# Patient Record
Sex: Male | Born: 1935 | Race: White | Hispanic: No | Marital: Married
Health system: Southern US, Community
[De-identification: ages and names within clinical notes are randomized; demographics above are authoritative.]

## PROBLEM LIST (undated history)

## (undated) DIAGNOSIS — G20 Parkinson's disease: Secondary | ICD-10-CM

---

## 2014-12-16 ENCOUNTER — Emergency Department (HOSPITAL_COMMUNITY)
Admission: EM | Admit: 2014-12-16 | Discharge: 2014-12-16 | Disposition: A | Discharge status: Home / Self Care | Payer: Medicare Other | Attending: Emergency Medicine | Admitting: Emergency Medicine

## 2014-12-16 ENCOUNTER — Encounter (HOSPITAL_COMMUNITY): Payer: Self-pay | Admitting: *Deleted

## 2014-12-16 ENCOUNTER — Emergency Department (HOSPITAL_COMMUNITY): Payer: Medicare Other

## 2014-12-16 DIAGNOSIS — Y93B2 Activity, push-ups, pull-ups, sit-ups: Secondary | ICD-10-CM | POA: Diagnosis not present

## 2014-12-16 DIAGNOSIS — F0390 Unspecified dementia without behavioral disturbance: Secondary | ICD-10-CM | POA: Insufficient documentation

## 2014-12-16 DIAGNOSIS — Y9289 Other specified places as the place of occurrence of the external cause: Secondary | ICD-10-CM | POA: Insufficient documentation

## 2014-12-16 DIAGNOSIS — Y998 Other external cause status: Secondary | ICD-10-CM | POA: Insufficient documentation

## 2014-12-16 DIAGNOSIS — G20 Parkinson's disease: Secondary | ICD-10-CM | POA: Insufficient documentation

## 2014-12-16 DIAGNOSIS — W050XXA Fall from non-moving wheelchair, initial encounter: Secondary | ICD-10-CM | POA: Diagnosis not present

## 2014-12-16 DIAGNOSIS — S43402A Unspecified sprain of left shoulder joint, initial encounter: Secondary | ICD-10-CM

## 2014-12-16 DIAGNOSIS — Z043 Encounter for examination and observation following other accident: Secondary | ICD-10-CM | POA: Diagnosis present

## 2014-12-16 DIAGNOSIS — W19XXXA Unspecified fall, initial encounter: Secondary | ICD-10-CM

## 2014-12-16 HISTORY — DX: Parkinson's disease: G20

## 2014-12-16 NOTE — ED Provider Notes (Signed)
CSN: [id number]     Arrival date & time 12/16/14  1045 History   First MD Initiated Contact with Patient 12/16/14 1104     Chief Complaint  Patient presents with  . Fall     (Consider location/radiation/quality/duration/timing/severity/associated sxs/prior Treatment) HPI  81 year old male with a history of Parkinson disease presents after falling out of his wheelchair. He was being pushed in a wheelchair up a slight incline and apparently fell out. The patient did not lose consciousness. The wife was with the patient all time and he did not hit his head. The patient does not communicate much and is not responding to questions but this is normal per the wife. It is unknown if he sustained any injuries but EMS was concerned about his clavicle.  Past Medical History  Diagnosis Date  . Parkinson disease    History reviewed. No pertinent past surgical history. No family history on file. History  Substance Use Topics  . Smoking status: Unknown If Ever Smoked  . Smokeless tobacco: Not on file  . Alcohol Use: Not on file    Review of Systems  Unable to perform ROS: Dementia      Allergies  Review of patient's allergies indicates no known allergies.  Home Medications   Prior to Admission medications   Not on File   BP 120/71 mmHg  Pulse 52  Temp(Src) 97.6 F (36.4 C) (Axillary)  Resp 20  SpO2 96% Physical Exam  Constitutional: He appears well-developed.  Chronically frail appearing. Contracted  HENT:  Head: Normocephalic and atraumatic.  Right Ear: External ear normal.  Left Ear: External ear normal.  Nose: Nose normal.  Eyes: Right eye exhibits no discharge. Left eye exhibits no discharge.  Neck: Neck supple.  Cardiovascular: Normal rate, regular rhythm, normal heart sounds and intact distal pulses.   Pulses:      Radial pulses are 2+ on the left side.  Pulmonary/Chest: Effort normal.  Abdominal: Soft. There is no tenderness.  Musculoskeletal: He exhibits no  edema.       Left shoulder: He exhibits no tenderness and no deformity.       Left elbow: He exhibits normal range of motion and no swelling.       Right hip: He exhibits normal range of motion.       Left hip: He exhibits normal range of motion.       Left upper arm: He exhibits no tenderness.  No focal tenderness on his upper extremity exam. The patient is moving his left elbow spontaneously and does not appear in pain. Left distal clavicle appears elevated. No tenting of skin  Neurological: He is alert. GCS eye subscore is 3. GCS verbal subscore is 3. GCS motor subscore is 6.  Skin: Skin is warm and dry.  Nursing note and vitals reviewed.   ED Course  Procedures (including critical care time) Labs Review Labs Reviewed - No data to display  Imaging Review Dg Clavicle Left  12/16/2014   CLINICAL DATA:  Status post fall today with a deformity about the left shoulder. Initial encounter.  EXAM: LEFT CLAVICLE - 2+ VIEWS  COMPARISON:  None.  FINDINGS: The patient has a transverse fracture through the mid diaphysis of the left clavicle. Fracture margins appear corticated. There is fragment override and superior angulation of the proximal fragment which tents the skin. Remote appearing left upper rib fractures are identified. No other fracture is seen.  IMPRESSION: Diaphyseal fracture left clavicle appears remote. No definite acute abnormality is  identified.  Remote left upper rib fractures.   Electronically Signed   By: Drusilla Kanner M.D.   On: 12/16/2014 12:08   Dg Shoulder Left  12/16/2014   CLINICAL DATA:  Fall from wheelchair with left clavicle deformity. Unresponsive.  EXAM: LEFT SHOULDER - 2+ VIEW  COMPARISON:  None.  FINDINGS: There is a mid clavicle fracture with fracture over riding and greater than 100% displacement. The fracture appears chronic as there is corticated margins and corticated bone fragments intervening. A clavicle radiograph is currently pending. Located appearance of the  glenohumeral and acromioclavicular joints. Multiple remote appearing left rib fractures. No acute fracture identified. Marked osteopenia.  IMPRESSION: 1. Remote appearing nonunited left mid clavicle fracture. 2. No acute fracture or dislocation.   Electronically Signed   By: Marnee Spring M.D.   On: 12/16/2014 12:06     EKG Interpretation None      MDM   Final diagnoses:  Fall, initial encounter  Shoulder sprain, left, initial encounter    The left clavicle is mildly elevated at its insertion to the shoulder but there is no tenderness or focal swelling. There is no tenting of the skin. He has evidence of an old clavicle fracture and my best suspicion is that this is an old finding but has never been noticed because no one looks his clavicle. He is not tender on repeat movement of the shoulder. I will give him a shoulder sling just in case but at this point there are no obvious injuries. He did not hit his head, lose consciousness, and there is no trauma that I can see. Hips move with passive range of motion without pain. Patient is stable for discharge back to his facility, follow-up with PCP.    Pricilla Loveless, MD 12/16/14 1236

## 2014-12-16 NOTE — ED Notes (Signed)
Wife at nurses station requesting information on a missing key around patients neck.  EMS that dropped patient off from facility denies recollection of a key.  Secretary called facility to determine location of the key, facility stated the key could be in the room at the facility.

## 2014-12-16 NOTE — ED Notes (Signed)
Per EMS- pt was sent from [facility] for a fall from his wheelchair. Pt was in wheel chair when he slid out of the chair and sat on the foot rests. Staff found him with his wife. Staff denied LOC ir hitting head. Pt noted to have deformity to left clavicle. Staff states that pt is minimally verbal at baseline.

## 2015-03-05 DEATH — deceased

## 2017-02-04 IMAGING — DX DG SHOULDER 2+V*L*
2 series · 3 of 3 positions shown · non-contrast
Comparison: None.

CLINICAL DATA: Fall from wheelchair with left clavicle deformity.
Unresponsive.

EXAM:
LEFT SHOULDER - 2+ VIEW

[Series 1: shoulder grashey · 0.14mm/px · 2 of 2 slices shown]
[im 1/2]
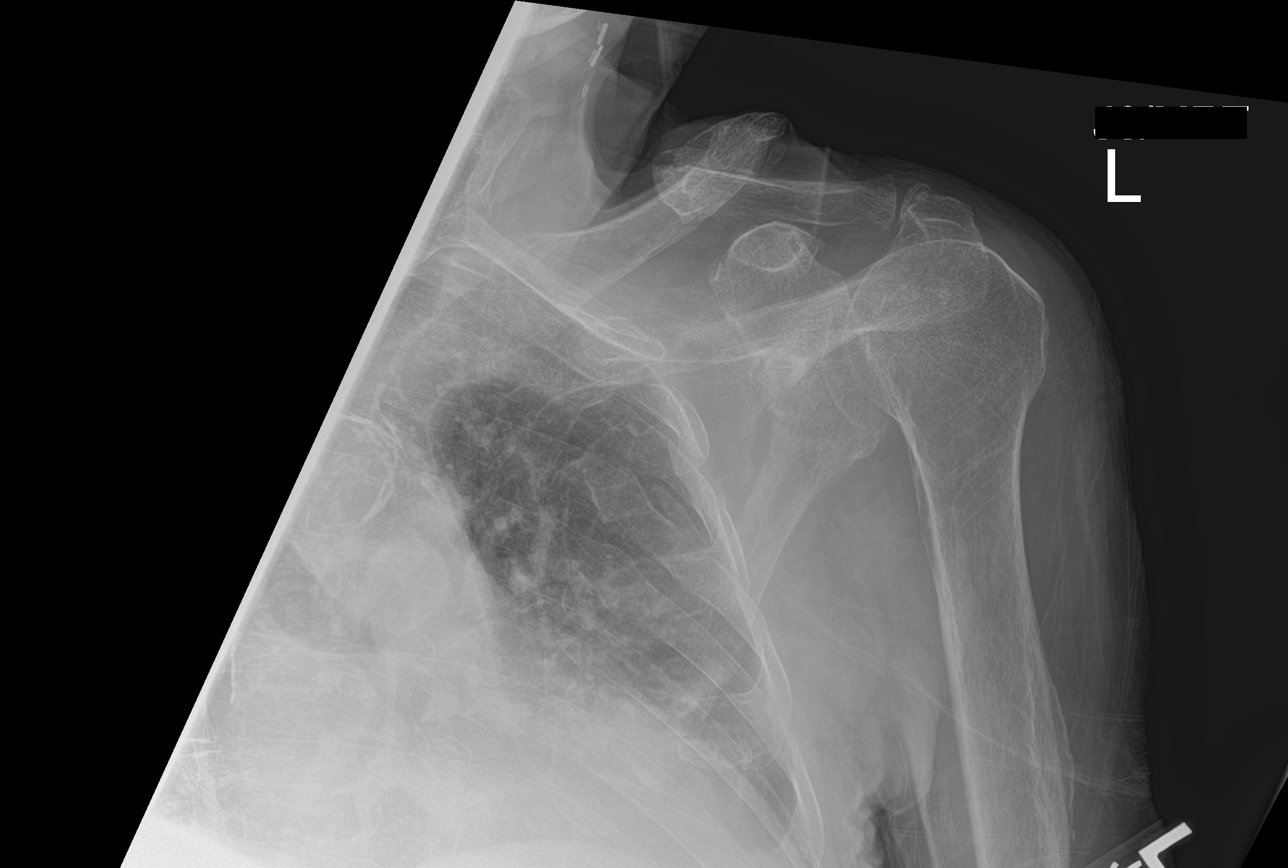
[im 2/2]
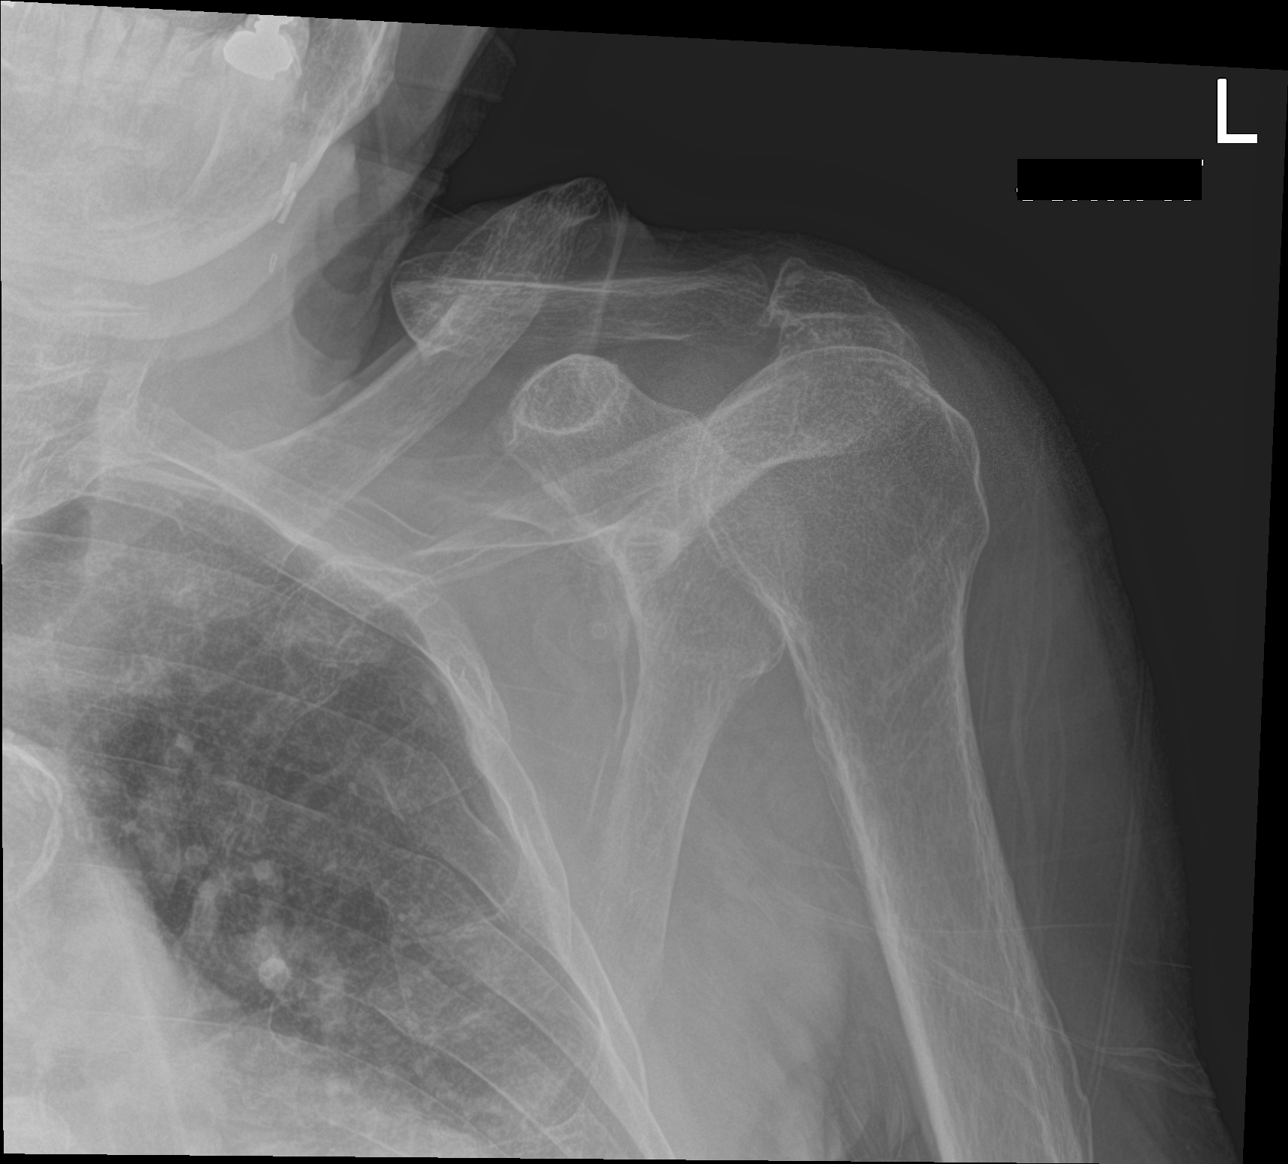

[shoulder y view]
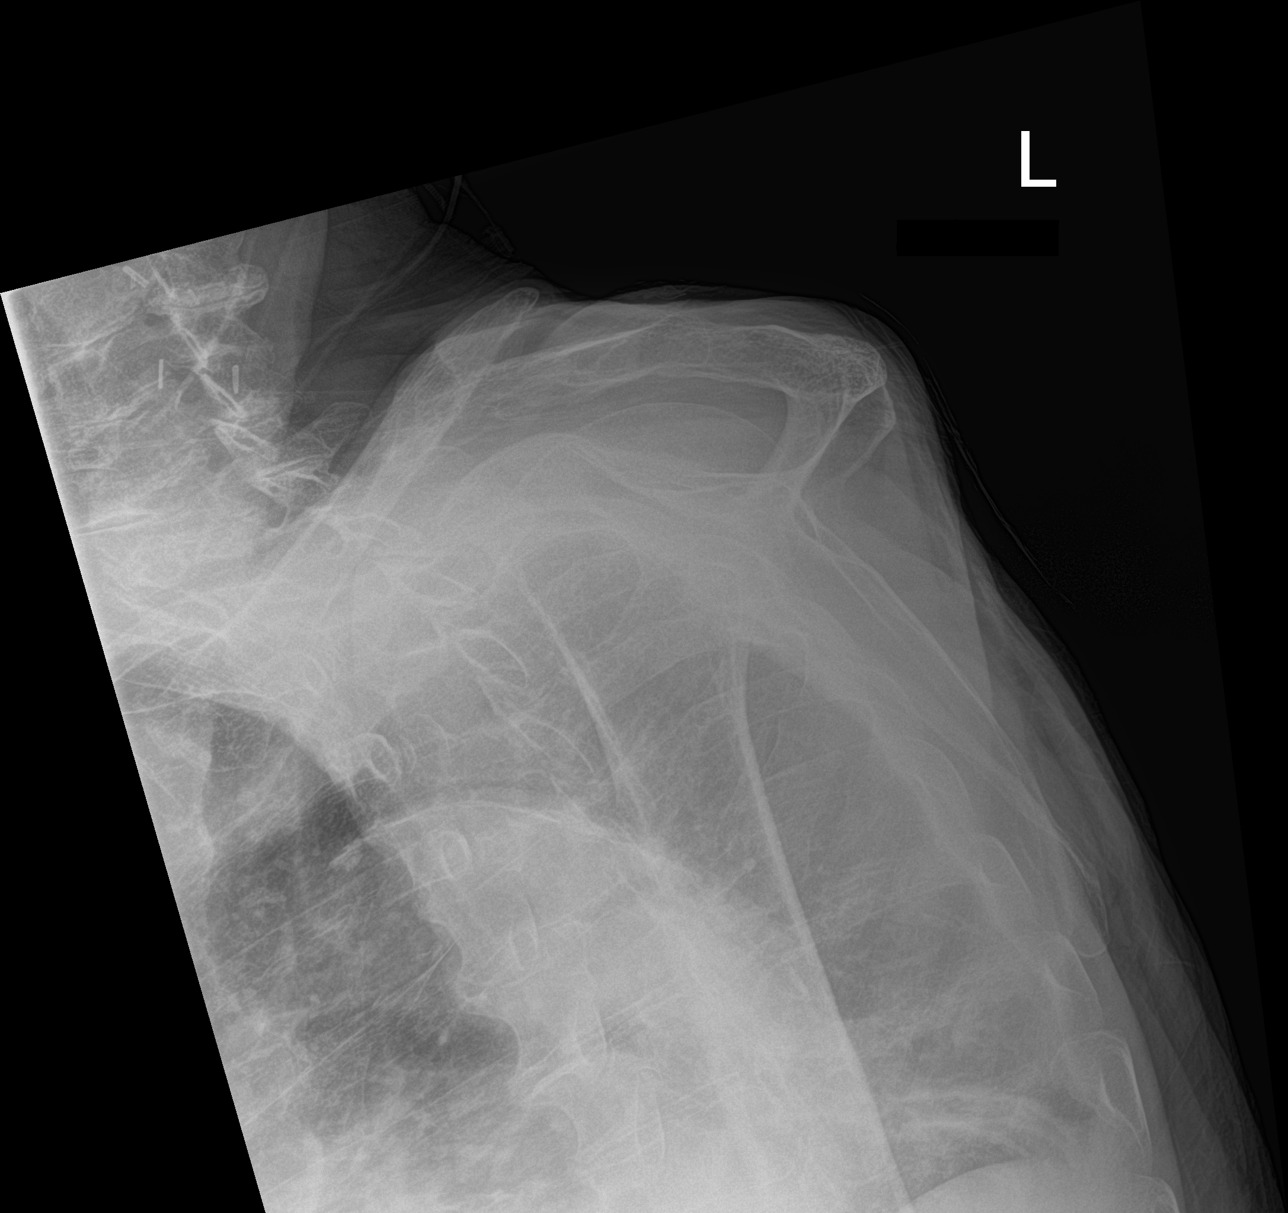

[3 of 3 positions shown; findings below may reference images not displayed]

FINDINGS: There is a mid clavicle fracture with fracture over riding and
greater than 100% displacement. The fracture appears chronic as
there is corticated margins and corticated bone fragments
intervening. A clavicle radiograph is currently pending. Located
appearance of the glenohumeral and acromioclavicular joints.
Multiple remote appearing left rib fractures. No acute fracture
identified. Marked osteopenia.
IMPRESSION: 1. Remote appearing nonunited left mid clavicle fracture.
2. No acute fracture or dislocation.

## 2017-02-04 IMAGING — DX DG CLAVICLE*L*
2 series · 2 of 2 positions shown · non-contrast
Comparison: None.

CLINICAL DATA: Status post fall today with a deformity about the
left shoulder. Initial encounter.

EXAM:
LEFT CLAVICLE - 2+ VIEWS

[clavicle ap]
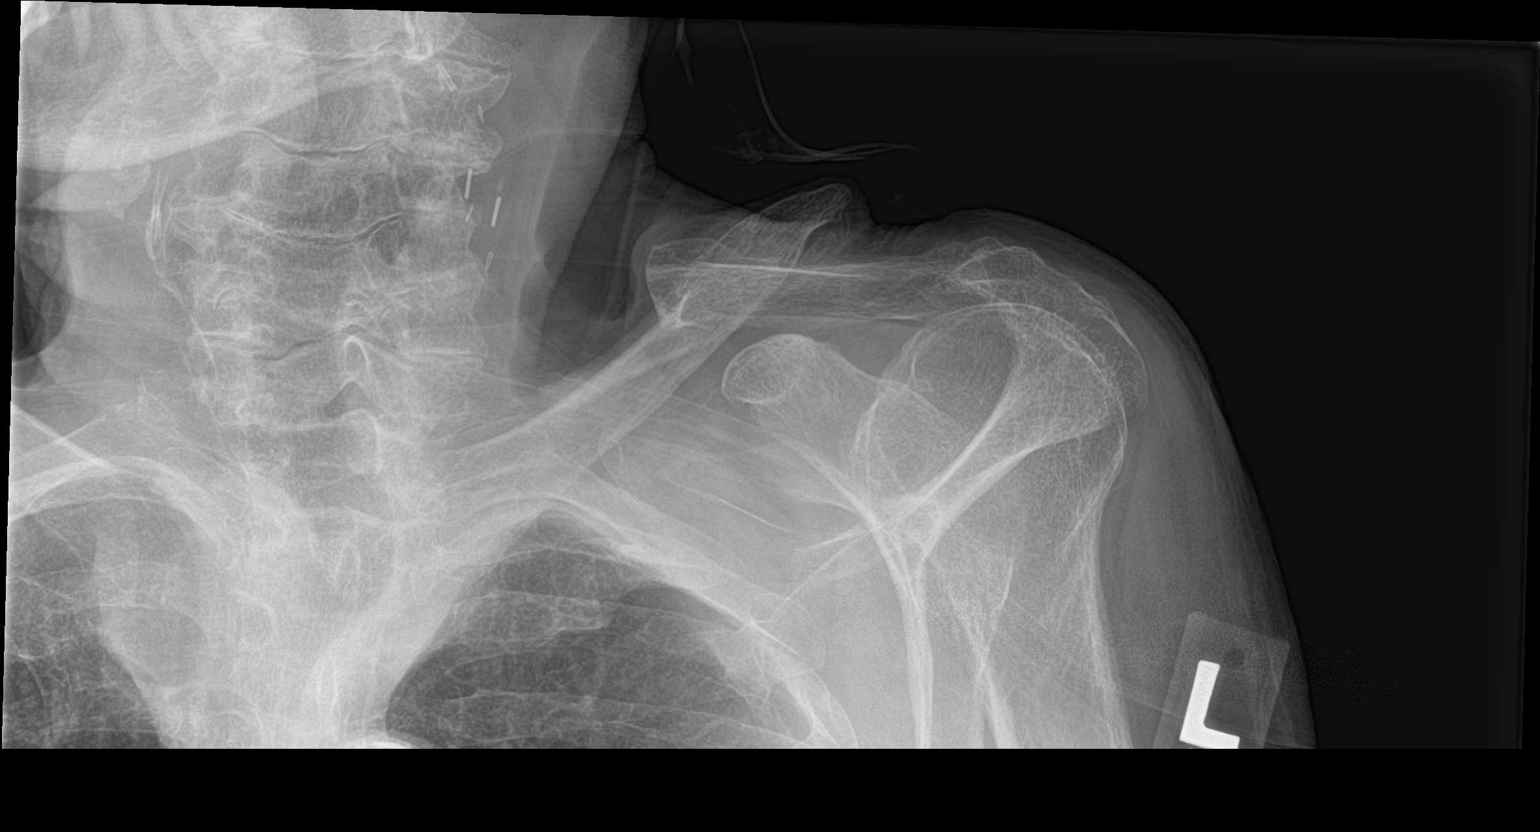

[clavicle axial]
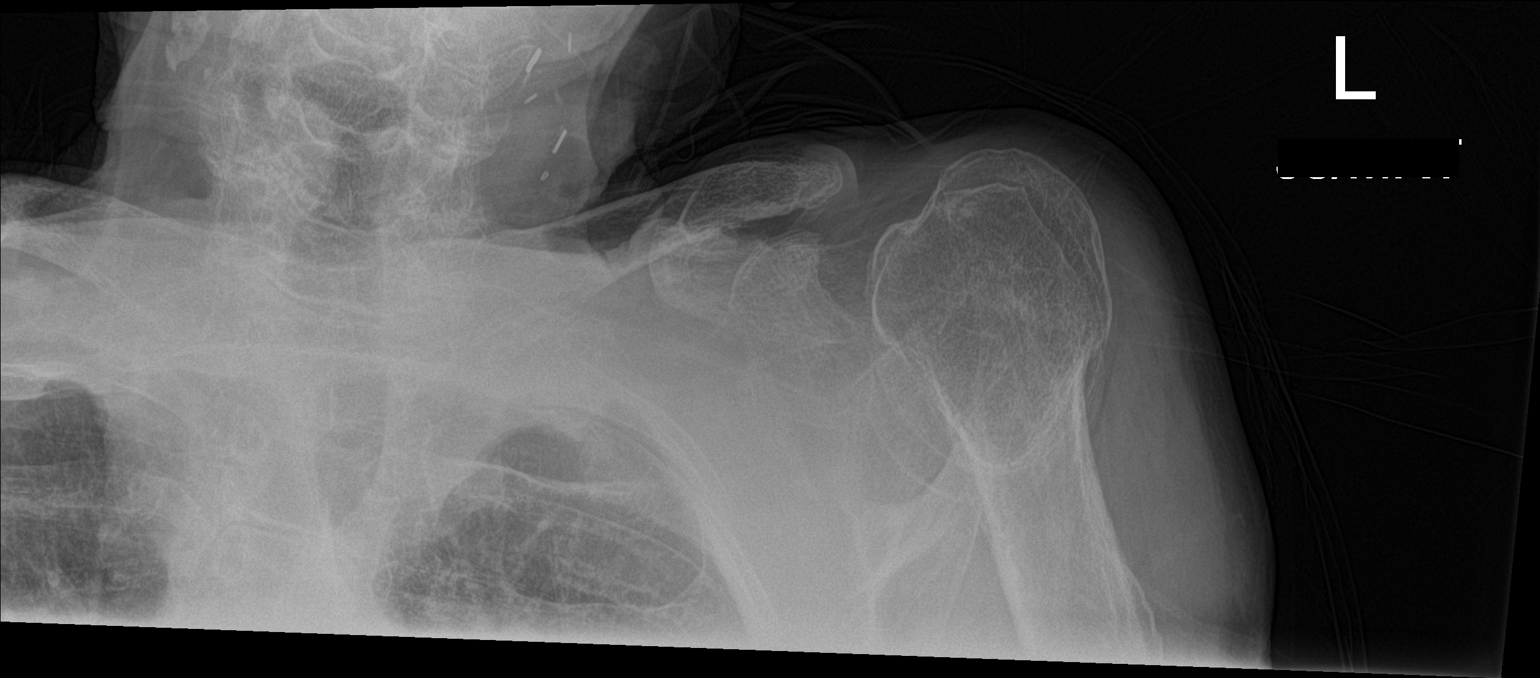

[2 of 2 positions shown; findings below may reference images not displayed]

FINDINGS: The patient has a transverse fracture through the mid diaphysis of
the left clavicle. Fracture margins appear corticated. There is
fragment override and superior angulation of the proximal fragment
which tents the skin. Remote appearing left upper rib fractures are
identified. No other fracture is seen.
IMPRESSION: Diaphyseal fracture left clavicle appears remote. No definite acute
abnormality is identified.

Remote left upper rib fractures.
